# Patient Record
Sex: Female | Born: 1940 | Race: White | Hispanic: No | Smoking: Never smoker
Health system: Southern US, Community
[De-identification: ages and names within clinical notes are randomized; demographics above are authoritative.]

## PROBLEM LIST (undated history)

## (undated) DIAGNOSIS — J449 Chronic obstructive pulmonary disease, unspecified: Secondary | ICD-10-CM

## (undated) DIAGNOSIS — E119 Type 2 diabetes mellitus without complications: Secondary | ICD-10-CM

## (undated) DIAGNOSIS — K219 Gastro-esophageal reflux disease without esophagitis: Secondary | ICD-10-CM

## (undated) DIAGNOSIS — I10 Essential (primary) hypertension: Secondary | ICD-10-CM

## (undated) HISTORY — PX: BACK SURGERY: SHX140

## (undated) HISTORY — PX: ROTATOR CUFF REPAIR: SHX139

## (undated) HISTORY — PX: SINUS EXPLORATION: SHX5214

## (undated) HISTORY — PX: JOINT REPLACEMENT: SHX530

## (undated) HISTORY — PX: ABDOMINAL HYSTERECTOMY: SHX81

---

## 2013-02-09 ENCOUNTER — Emergency Department (HOSPITAL_BASED_OUTPATIENT_CLINIC_OR_DEPARTMENT_OTHER)
Admission: EM | Admit: 2013-02-09 | Discharge: 2013-02-10 | Disposition: A | Discharge status: Home / Self Care | Payer: Medicare PPO | Attending: Emergency Medicine | Admitting: Emergency Medicine

## 2013-02-09 ENCOUNTER — Encounter (HOSPITAL_BASED_OUTPATIENT_CLINIC_OR_DEPARTMENT_OTHER): Payer: Self-pay | Admitting: *Deleted

## 2013-02-09 DIAGNOSIS — Z79899 Other long term (current) drug therapy: Secondary | ICD-10-CM | POA: Insufficient documentation

## 2013-02-09 DIAGNOSIS — Z7982 Long term (current) use of aspirin: Secondary | ICD-10-CM | POA: Insufficient documentation

## 2013-02-09 DIAGNOSIS — J441 Chronic obstructive pulmonary disease with (acute) exacerbation: Secondary | ICD-10-CM | POA: Insufficient documentation

## 2013-02-09 DIAGNOSIS — R209 Unspecified disturbances of skin sensation: Secondary | ICD-10-CM | POA: Insufficient documentation

## 2013-02-09 DIAGNOSIS — E119 Type 2 diabetes mellitus without complications: Secondary | ICD-10-CM | POA: Insufficient documentation

## 2013-02-09 DIAGNOSIS — K219 Gastro-esophageal reflux disease without esophagitis: Secondary | ICD-10-CM | POA: Insufficient documentation

## 2013-02-09 DIAGNOSIS — I10 Essential (primary) hypertension: Secondary | ICD-10-CM | POA: Insufficient documentation

## 2013-02-09 DIAGNOSIS — IMO0002 Reserved for concepts with insufficient information to code with codable children: Secondary | ICD-10-CM | POA: Insufficient documentation

## 2013-02-09 HISTORY — DX: Type 2 diabetes mellitus without complications: E11.9

## 2013-02-09 HISTORY — DX: Gastro-esophageal reflux disease without esophagitis: K21.9

## 2013-02-09 HISTORY — DX: Chronic obstructive pulmonary disease, unspecified: J44.9

## 2013-02-09 HISTORY — DX: Essential (primary) hypertension: I10

## 2013-02-09 LAB — BASIC METABOLIC PANEL
BUN: 13 mg/dL (ref 6–23)
Calcium: 9.6 mg/dL (ref 8.4–10.5)
Creatinine, Ser: 1.2 mg/dL — ABNORMAL HIGH (ref 0.50–1.10)
GFR calc non Af Amer: 45 mL/min — ABNORMAL LOW (ref >90)
Glucose, Bld: 105 mg/dL — ABNORMAL HIGH (ref 70–99)
Potassium: 4.3 mEq/L (ref 3.5–5.1)

## 2013-02-09 NOTE — ED Notes (Signed)
MD at bedside. 

## 2013-02-09 NOTE — ED Notes (Signed)
Pt reports increased BP and left arm tingling x 6 hrs ago

## 2013-02-09 NOTE — ED Provider Notes (Signed)
CSN: [id number]     Arrival date & time 02/09/13  2028 History   First MD Initiated Contact with Patient 02/09/13 2117     Chief Complaint  Patient presents with  . Hypertension   (Consider location/radiation/quality/duration/timing/severity/associated sxs/prior Treatment) HPI Comments: Patient presents with elevated blood pressure.  She does have a history of hypertension. She usually takes her blood pressure in the morning but took it a little later today at 12:00. She denies any symptoms with it. She did have 2 episodes of tingling in her left forearm. Each lasted about 1 minute. She had no associated chest tightness or pain. She had no associated shortness of breath. No weakness in the arm. She had no leg symptoms. She denies any headache. She denies any speech or vision changes. She did have some shortness of breath and wheezing earlier consistent with her COPD and this improved after she took Advair. She's currently asymptomatic. She states her shortness of breath was similar to her past episodes and there is no unusual symptoms.  Patient is a 72 y.o. female presenting with hypertension.  Hypertension Associated symptoms include shortness of breath. Pertinent negatives include no chest pain, no abdominal pain and no headaches.    Past Medical History  Diagnosis Date  . Hypertension   . COPD (chronic obstructive pulmonary disease)   . GERD (gastroesophageal reflux disease)   . Diabetes mellitus without complication    Past Surgical History  Procedure Laterality Date  . Back surgery    . Joint replacement    . Sinus exploration    . Rotator cuff repair    . Abdominal hysterectomy     History reviewed. No pertinent family history. History  Substance Use Topics  . Smoking status: Never Smoker   . Smokeless tobacco: Not on file  . Alcohol Use: No   OB History   Grav Para Term Preterm Abortions TAB SAB Ect Mult Living                 Review of Systems  Constitutional:  Negative for fever, chills, diaphoresis and fatigue.  HENT: Negative for congestion, rhinorrhea and sneezing.   Eyes: Negative.   Respiratory: Positive for shortness of breath. Negative for cough and chest tightness.   Cardiovascular: Negative for chest pain and leg swelling.  Gastrointestinal: Negative for nausea, vomiting, abdominal pain, diarrhea and blood in stool.  Genitourinary: Negative for frequency, hematuria, flank pain and difficulty urinating.  Musculoskeletal: Negative for back pain and arthralgias.  Skin: Negative for rash.  Neurological: Positive for numbness. Negative for dizziness, speech difficulty, weakness and headaches.    Allergies  Compazine; Lisinopril; Oxycontin; and Zithromax  Home Medications   Current Outpatient Rx  Name  Route  Sig  Dispense  Refill  . alendronate (FOSAMAX) 70 MG tablet   Oral   Take 70 mg by mouth every 7 (seven) days. Take with a full glass of water on an empty stomach.         Marland Kitchen amitriptyline (ELAVIL) 50 MG tablet   Oral   Take 50 mg by mouth at bedtime.         Marland Kitchen aspirin 81 MG tablet   Oral   Take 81 mg by mouth daily.         Marland Kitchen atorvastatin (LIPITOR) 40 MG tablet   Oral   Take 40 mg by mouth daily.         . cyclobenzaprine (FLEXERIL) 10 MG tablet   Oral   Take  10 mg by mouth 3 (three) times daily as needed for muscle spasms.         . Fluticasone-Salmeterol (ADVAIR) 250-50 MCG/DOSE AEPB   Inhalation   Inhale 1 puff into the lungs every 12 (twelve) hours.         Marland Kitchen glipiZIDE (GLUCOTROL) 5 MG tablet   Oral   Take 5 mg by mouth 2 (two) times daily before a meal.         . HYDROcodone-acetaminophen (NORCO/VICODIN) 5-325 MG per tablet   Oral   Take 1 tablet by mouth every 6 (six) hours as needed for pain.         Marland Kitchen losartan (COZAAR) 50 MG tablet   Oral   Take 50 mg by mouth daily.         . metoprolol (LOPRESSOR) 50 MG tablet   Oral   Take 50 mg by mouth 2 (two) times daily.         Marland Kitchen  morphine (KADIAN) 30 MG 24 hr capsule   Oral   Take 30 mg by mouth daily.         Marland Kitchen omeprazole (PRILOSEC) 20 MG capsule   Oral   Take 20 mg by mouth daily.         . sitaGLIPtin (JANUVIA) 50 MG tablet   Oral   Take 50 mg by mouth daily.          BP 169/73  Pulse 78  Temp(Src) 98.7 F (37.1 C) (Oral)  Resp 18  Ht 5' (1.524 m)  Wt 185 lb (83.915 kg)  BMI 36.13 kg/m2  SpO2 99% Physical Exam  Constitutional: She is oriented to person, place, and time. She appears well-developed and well-nourished.  HENT:  Head: Normocephalic and atraumatic.  Eyes: Pupils are equal, round, and reactive to light.  Neck: Normal range of motion. Neck supple.  Cardiovascular: Normal rate, regular rhythm and normal heart sounds.   Pulmonary/Chest: Effort normal and breath sounds normal. No respiratory distress. She has no wheezes. She has no rales. She exhibits no tenderness.  Abdominal: Soft. Bowel sounds are normal. There is no tenderness. There is no rebound and no guarding.  Musculoskeletal: Normal range of motion. She exhibits no edema.  Lymphadenopathy:    She has no cervical adenopathy.  Neurological: She is alert and oriented to person, place, and time. She has normal strength. No cranial nerve deficit or sensory deficit. GCS eye subscore is 4. GCS verbal subscore is 5. GCS motor subscore is 6.  Finger to nose intact. Her motor strength is symmetric in the arms but she cannot lift either of her arms above chest height given rotator cuff injuries  Skin: Skin is warm and dry. No rash noted.  Psychiatric: She has a normal mood and affect.    ED Course  Procedures (including critical care time) Labs Review Results for orders placed during the hospital encounter of 02/09/13  BASIC METABOLIC PANEL      Result Value Range   Sodium 140  135 - 145 mEq/L   Potassium 4.3  3.5 - 5.1 mEq/L   Chloride 105  96 - 112 mEq/L   CO2 25  19 - 32 mEq/L   Glucose, Bld 105 (*) 70 - 99 mg/dL   BUN 13  6  - 23 mg/dL   Creatinine, Ser 1.61 (*) 0.50 - 1.10 mg/dL   Calcium 9.6  8.4 - 09.6 mg/dL   GFR calc non Af Amer 45 (*) >90 mL/min  GFR calc Af Amer 52 (*) >90 mL/min  TROPONIN I      Result Value Range   Troponin I <0.30  <0.30 ng/mL   No results found.   Date: 02/09/2013  Rate: 66  Rhythm: normal sinus rhythm  QRS Axis: normal  Intervals: normal  ST/T Wave abnormalities: normal  Conduction Disutrbances:none  Narrative Interpretation:   Old EKG Reviewed: none available   Imaging Review No results found.  MDM   1. Hypertension    Patient presents with elevation in her blood pressure. She currently has no other associated symptoms. She had 2 episodes of tingling in her left forearm but each lasted a minute or less. She had no other neurologic symptoms suggestive of a stroke. She had no other symptoms suggestive of acute coronary syndrome. She has been asymptomatic to the ED course. Identifies her that her creatinine was mildly elevated. We have no old blood work your for comparison. I advised her to have this followed up by her primary care physician. I advised her to have her blood pressure rechecked within the next few days by her primary care physician or return here as needed if she has any associated symptoms.    Rolan Bucco, MD 02/09/13 [phone]

## 2013-10-14 ENCOUNTER — Emergency Department (HOSPITAL_BASED_OUTPATIENT_CLINIC_OR_DEPARTMENT_OTHER)
Admission: EM | Admit: 2013-10-14 | Discharge: 2013-10-14 | Disposition: A | Discharge status: Other Acute Inpatient Hospital | Payer: Medicare PPO | Attending: Emergency Medicine | Admitting: Emergency Medicine

## 2013-10-14 ENCOUNTER — Emergency Department (HOSPITAL_BASED_OUTPATIENT_CLINIC_OR_DEPARTMENT_OTHER): Payer: Medicare PPO

## 2013-10-14 ENCOUNTER — Encounter (HOSPITAL_BASED_OUTPATIENT_CLINIC_OR_DEPARTMENT_OTHER): Payer: Self-pay | Admitting: Emergency Medicine

## 2013-10-14 DIAGNOSIS — M7981 Nontraumatic hematoma of soft tissue: Secondary | ICD-10-CM | POA: Diagnosis not present

## 2013-10-14 DIAGNOSIS — K219 Gastro-esophageal reflux disease without esophagitis: Secondary | ICD-10-CM | POA: Diagnosis not present

## 2013-10-14 DIAGNOSIS — Z7982 Long term (current) use of aspirin: Secondary | ICD-10-CM | POA: Insufficient documentation

## 2013-10-14 DIAGNOSIS — B372 Candidiasis of skin and nail: Secondary | ICD-10-CM | POA: Diagnosis not present

## 2013-10-14 DIAGNOSIS — J449 Chronic obstructive pulmonary disease, unspecified: Secondary | ICD-10-CM | POA: Diagnosis not present

## 2013-10-14 DIAGNOSIS — Z7983 Long term (current) use of bisphosphonates: Secondary | ICD-10-CM | POA: Insufficient documentation

## 2013-10-14 DIAGNOSIS — R609 Edema, unspecified: Secondary | ICD-10-CM | POA: Diagnosis not present

## 2013-10-14 DIAGNOSIS — IMO0002 Reserved for concepts with insufficient information to code with codable children: Secondary | ICD-10-CM | POA: Insufficient documentation

## 2013-10-14 DIAGNOSIS — N390 Urinary tract infection, site not specified: Secondary | ICD-10-CM | POA: Diagnosis not present

## 2013-10-14 DIAGNOSIS — J4489 Other specified chronic obstructive pulmonary disease: Secondary | ICD-10-CM | POA: Insufficient documentation

## 2013-10-14 DIAGNOSIS — F29 Unspecified psychosis not due to a substance or known physiological condition: Secondary | ICD-10-CM | POA: Insufficient documentation

## 2013-10-14 DIAGNOSIS — E119 Type 2 diabetes mellitus without complications: Secondary | ICD-10-CM | POA: Insufficient documentation

## 2013-10-14 DIAGNOSIS — I10 Essential (primary) hypertension: Secondary | ICD-10-CM | POA: Diagnosis not present

## 2013-10-14 DIAGNOSIS — R5381 Other malaise: Secondary | ICD-10-CM | POA: Diagnosis present

## 2013-10-14 DIAGNOSIS — Z79899 Other long term (current) drug therapy: Secondary | ICD-10-CM | POA: Diagnosis not present

## 2013-10-14 DIAGNOSIS — R0602 Shortness of breath: Secondary | ICD-10-CM | POA: Diagnosis not present

## 2013-10-14 DIAGNOSIS — N289 Disorder of kidney and ureter, unspecified: Secondary | ICD-10-CM

## 2013-10-14 DIAGNOSIS — R410 Disorientation, unspecified: Secondary | ICD-10-CM

## 2013-10-14 DIAGNOSIS — R5383 Other fatigue: Secondary | ICD-10-CM | POA: Diagnosis present

## 2013-10-14 LAB — COMPREHENSIVE METABOLIC PANEL
ALBUMIN: 4 g/dL (ref 3.5–5.2)
ALK PHOS: 107 U/L (ref 39–117)
ALT: 20 U/L (ref 0–35)
AST: 36 U/L (ref 0–37)
BILIRUBIN TOTAL: 0.9 mg/dL (ref 0.3–1.2)
BUN: 55 mg/dL — ABNORMAL HIGH (ref 6–23)
CHLORIDE: 94 meq/L — AB (ref 96–112)
CO2: 26 meq/L (ref 19–32)
CREATININE: 2.2 mg/dL — AB (ref 0.50–1.10)
Calcium: 13.8 mg/dL (ref 8.4–10.5)
GFR calc Af Amer: 25 mL/min — ABNORMAL LOW (ref >90)
GFR, EST NON AFRICAN AMERICAN: 21 mL/min — AB (ref >90)
Glucose, Bld: 119 mg/dL — ABNORMAL HIGH (ref 70–99)
POTASSIUM: 4.9 meq/L (ref 3.7–5.3)
Sodium: 136 mEq/L — ABNORMAL LOW (ref 137–147)
Total Protein: 8.3 g/dL (ref 6.0–8.3)

## 2013-10-14 LAB — CBC WITH DIFFERENTIAL/PLATELET
BASOS ABS: 0 10*3/uL (ref 0.0–0.1)
BASOS PCT: 0 % (ref 0–1)
Eosinophils Absolute: 0.1 10*3/uL (ref 0.0–0.7)
Eosinophils Relative: 1 % (ref 0–5)
HEMATOCRIT: 42.9 % (ref 36.0–46.0)
HEMOGLOBIN: 14.5 g/dL (ref 12.0–15.0)
LYMPHS PCT: 13 % (ref 12–46)
Lymphs Abs: 1.6 10*3/uL (ref 0.7–4.0)
MCH: 34.5 pg — ABNORMAL HIGH (ref 26.0–34.0)
MCHC: 33.8 g/dL (ref 30.0–36.0)
MCV: 102.1 fL — ABNORMAL HIGH (ref 78.0–100.0)
MONO ABS: 1 10*3/uL (ref 0.1–1.0)
Monocytes Relative: 9 % (ref 3–12)
NEUTROS ABS: 9 10*3/uL — AB (ref 1.7–7.7)
NEUTROS PCT: 77 % (ref 43–77)
Platelets: 232 10*3/uL (ref 150–400)
RBC: 4.2 MIL/uL (ref 3.87–5.11)
RDW: 13.2 % (ref 11.5–15.5)
WBC: 11.6 10*3/uL — AB (ref 4.0–10.5)

## 2013-10-14 LAB — PRO B NATRIURETIC PEPTIDE: PRO B NATRI PEPTIDE: 341.7 pg/mL — AB (ref 0–125)

## 2013-10-14 LAB — URINALYSIS, ROUTINE W REFLEX MICROSCOPIC
BILIRUBIN URINE: NEGATIVE
GLUCOSE, UA: NEGATIVE mg/dL
KETONES UR: NEGATIVE mg/dL
Nitrite: POSITIVE — AB
PROTEIN: NEGATIVE mg/dL
Specific Gravity, Urine: 1.019 (ref 1.005–1.030)
UROBILINOGEN UA: 1 mg/dL (ref 0.0–1.0)
pH: 6.5 (ref 5.0–8.0)

## 2013-10-14 LAB — URINE MICROSCOPIC-ADD ON

## 2013-10-14 LAB — TROPONIN I

## 2013-10-14 MED ORDER — SODIUM CHLORIDE 0.9 % IV BOLUS (SEPSIS)
500.0000 mL | Freq: Once | INTRAVENOUS | Status: AC
  Administered 2013-10-14: 500 mL via INTRAVENOUS

## 2013-10-14 MED ORDER — DEXTROSE 5 % IV SOLN
1.0000 g | Freq: Once | INTRAVENOUS | Status: AC
  Administered 2013-10-14: 1 g via INTRAVENOUS

## 2013-10-14 MED ORDER — FLUCONAZOLE 100 MG PO TABS
200.0000 mg | ORAL_TABLET | Freq: Once | ORAL | Status: AC
  Administered 2013-10-14: 200 mg via ORAL
  Filled 2013-10-14: qty 2

## 2013-10-14 MED ORDER — CEFTRIAXONE SODIUM 1 G IJ SOLR
INTRAMUSCULAR | Status: AC
  Filled 2013-10-14: qty 10

## 2013-10-14 NOTE — ED Notes (Signed)
Chest x-ray images and Cat [facility] at 1543 pm by me Sherrine Maples(Glenn).

## 2013-10-14 NOTE — ED Notes (Addendum)
Per daughter and patient she has been feeling weak and slipped out of her wheelchair last night and again this morning. Was [facility]B and released on Wed.  Med just added after discharge gabapentin, hydralazine, furosemide imdur er

## 2013-10-14 NOTE — ED Provider Notes (Addendum)
CSN: [id number]     Arrival date & time 10/14/13  1210 History   First MD Initiated Contact with Patient 10/14/13 1223     Chief Complaint  Patient presents with  . Fatigue     (Consider location/radiation/quality/duration/timing/severity/associated sxs/prior Treatment) HPI Comments: Patient presents with increased weakness and confusion. She was recently admitted to [facility]. She was admitted on Monday and discharged Friday which was yesterday. Per her daughter, it sounds that she was admitted for shortness of breath and CHF. Her daughter noted on Thursday that she was becoming more confused than normal. Yesterday on discharge, she seems fairly confused to the daughter and today her confusion was worse. She's been having some generalized weakness and difficulty walking related to that but it does not sound like she's had any ataxia. She denies any focal deficits or numbness in her extremities. She did have a Foley catheter in placed during the hospitalization but she currently denies any urinary symptoms. She denies any chest pain or shortness of breath although she said she's had some intermittent episodes over the last few days. She denies any fevers or chills. Her primary care physician is with [facility]. History is a bit limited due to her confusion.   Past Medical History  Diagnosis Date  . Hypertension   . COPD (chronic obstructive pulmonary disease)   . GERD (gastroesophageal reflux disease)   . Diabetes mellitus without complication    Past Surgical History  Procedure Laterality Date  . Back surgery    . Sinus exploration    . Rotator cuff repair      bilateral  . Abdominal hysterectomy    . Joint replacement      L knee   No family history on file. History  Substance Use Topics  . Smoking status: Never Smoker   . Smokeless tobacco: Not on file  . Alcohol Use: No   OB History   Grav Para Term Preterm Abortions TAB SAB Ect  Mult Living                 Review of Systems  Constitutional: Positive for fatigue. Negative for fever, chills and diaphoresis.  HENT: Negative for congestion, rhinorrhea and sneezing.   Eyes: Negative.   Respiratory: Positive for chest tightness and shortness of breath. Negative for cough.   Cardiovascular: Negative for chest pain and leg swelling.  Gastrointestinal: Negative for nausea, vomiting, abdominal pain, diarrhea and blood in stool.  Genitourinary: Negative for frequency, hematuria, flank pain and difficulty urinating.  Musculoskeletal: Negative for arthralgias and back pain.  Skin: Negative for rash.  Neurological: Positive for weakness (generalized). Negative for dizziness, speech difficulty, numbness and headaches.  Psychiatric/Behavioral: Positive for confusion.      Allergies  Compazine; Lisinopril; Oxycontin; and Zithromax  Home Medications   Prior to Admission medications   Medication Sig Start Date End Date Taking? Authorizing Provider  amLODipine (NORVASC) 2.5 MG tablet Take 2.5 mg by mouth daily.   Yes Historical Provider, MD  Cholecalciferol (VITAMIN D3) 1000 UNITS CAPS Take by mouth.   Yes Historical Provider, MD  furosemide (LASIX) 20 MG tablet Take 20 mg by mouth every other day.   Yes Historical Provider, MD  gabapentin (NEURONTIN) 100 MG capsule Take 100 mg by mouth 3 (three) times daily.   Yes Historical Provider, MD  hydrALAZINE (APRESOLINE) 25 MG tablet Take 25 mg by mouth 3 (three) times daily.   Yes Historical Provider, MD  isosorbide mononitrate (  IMDUR) 60 MG 24 hr tablet Take 60 mg by mouth daily.   Yes Historical Provider, MD  montelukast (SINGULAIR) 10 MG tablet Take 10 mg by mouth at bedtime.   Yes Historical Provider, MD  NYSTATIN, TOPICAL, POWD Apply topically.   Yes Historical Provider, MD  risedronate (ACTONEL) 150 MG tablet Take 150 mg by mouth every 30 (thirty) days. with water on empty stomach, nothing by mouth or lie down for next 30  minutes.   Yes Historical Provider, MD  tizanidine (ZANAFLEX) 2 MG capsule Take 2 mg by mouth 3 (three) times daily.   Yes Historical Provider, MD  alendronate (FOSAMAX) 70 MG tablet Take 70 mg by mouth every 7 (seven) days. Take with a full glass of water on an empty stomach.    Historical Provider, MD  amitriptyline (ELAVIL) 50 MG tablet Take 50 mg by mouth at bedtime.    Historical Provider, MD  aspirin 81 MG tablet Take 81 mg by mouth daily.    Historical Provider, MD  atorvastatin (LIPITOR) 40 MG tablet Take 40 mg by mouth daily.    Historical Provider, MD  cyclobenzaprine (FLEXERIL) 10 MG tablet Take 5 mg by mouth 3 (three) times daily as needed for muscle spasms.     Historical Provider, MD  Fluticasone-Salmeterol (ADVAIR) 250-50 MCG/DOSE AEPB Inhale 1 puff into the lungs every 12 (twelve) hours.    Historical Provider, MD  glipiZIDE (GLUCOTROL XL) 5 MG 24 hr tablet Take 5 mg by mouth daily.    Historical Provider, MD  glipiZIDE (GLUCOTROL) 5 MG tablet Take 5 mg by mouth 2 (two) times daily before a meal.    Historical Provider, MD  HYDROcodone-acetaminophen (NORCO/VICODIN) 5-325 MG per tablet Take 1 tablet by mouth every 6 (six) hours as needed for pain.    Historical Provider, MD  losartan (COZAAR) 50 MG tablet Take 50 mg by mouth daily.    Historical Provider, MD  metoprolol (LOPRESSOR) 50 MG tablet Take 50 mg by mouth 2 (two) times daily.    Historical Provider, MD  metoprolol succinate (TOPROL-XL) 50 MG 24 hr tablet Take 50 mg by mouth daily. Take with or immediately following a meal.    Historical Provider, MD  morphine (KADIAN) 30 MG 24 hr capsule Take 30 mg by mouth daily.    Historical Provider, MD  omeprazole (PRILOSEC) 20 MG capsule Take 20 mg by mouth daily.    Historical Provider, MD  sitaGLIPtin (JANUVIA) 50 MG tablet Take 50 mg by mouth daily.    Historical Provider, MD   BP 143/66  Pulse 99  Temp(Src) 98.2 F (36.8 C) (Oral)  Resp 20  SpO2 94% Physical Exam   Constitutional: She appears well-developed and well-nourished.  HENT:  Head: Normocephalic and atraumatic.  Eyes: Pupils are equal, round, and reactive to light.  Neck: Normal range of motion. Neck supple.  Cardiovascular: Normal rate, regular rhythm and normal heart sounds.   Pulmonary/Chest: Effort normal and breath sounds normal. No respiratory distress. She has no wheezes. She has no rales. She exhibits no tenderness.  Abdominal: Soft. Bowel sounds are normal. There is no tenderness. There is no rebound and no guarding.  Some ecchymosis across lower, no abd tenderness   Musculoskeletal: Normal range of motion. She exhibits edema (bilateral mild pitting edema.  skin  changes to pretibial area consistent with chronic edema).  Lymphadenopathy:    She has no cervical adenopathy.  Neurological: She is alert. She has normal strength. No cranial nerve deficit or  sensory deficit. Coordination normal. GCS eye subscore is 4. GCS verbal subscore is 4. GCS motor subscore is 6.  Finger to nose intact, no pronator drift, answers questions, but confused at times  Skin: Skin is warm and dry. No rash noted.  Candidal infection under left abdominal panus  Psychiatric: She has a normal mood and affect.    ED Course  Procedures (including critical care time) Labs Review Results for orders placed during the hospital encounter of 10/14/13  CBC WITH DIFFERENTIAL      Result Value Ref Range   WBC 11.6 (*) 4.0 - 10.5 K/uL   RBC 4.20  3.87 - 5.11 MIL/uL   Hemoglobin 14.5  12.0 - 15.0 g/dL   HCT 78.2  95.6 - 21.3 %   MCV 102.1 (*) 78.0 - 100.0 fL   MCH 34.5 (*) 26.0 - 34.0 pg   MCHC 33.8  30.0 - 36.0 g/dL   RDW 08.6  57.8 - 46.9 %   Platelets 232  150 - 400 K/uL   Neutrophils Relative % 77  43 - 77 %   Neutro Abs 9.0 (*) 1.7 - 7.7 K/uL   Lymphocytes Relative 13  12 - 46 %   Lymphs Abs 1.6  0.7 - 4.0 K/uL   Monocytes Relative 9  3 - 12 %   Monocytes Absolute 1.0  0.1 - 1.0 K/uL   Eosinophils  Relative 1  0 - 5 %   Eosinophils Absolute 0.1  0.0 - 0.7 K/uL   Basophils Relative 0  0 - 1 %   Basophils Absolute 0.0  0.0 - 0.1 K/uL  COMPREHENSIVE METABOLIC PANEL      Result Value Ref Range   Sodium 136 (*) 137 - 147 mEq/L   Potassium 4.9  3.7 - 5.3 mEq/L   Chloride 94 (*) 96 - 112 mEq/L   CO2 26  19 - 32 mEq/L   Glucose, Bld 119 (*) 70 - 99 mg/dL   BUN 55 (*) 6 - 23 mg/dL   Creatinine, Ser 6.29 (*) 0.50 - 1.10 mg/dL   Calcium 52.8 (*) 8.4 - 10.5 mg/dL   Total Protein 8.3  6.0 - 8.3 g/dL   Albumin 4.0  3.5 - 5.2 g/dL   AST 36  0 - 37 U/L   ALT 20  0 - 35 U/L   Alkaline Phosphatase 107  39 - 117 U/L   Total Bilirubin 0.9  0.3 - 1.2 mg/dL   GFR calc non Af Amer 21 (*) >90 mL/min   GFR calc Af Amer 25 (*) >90 mL/min  URINALYSIS, ROUTINE W REFLEX MICROSCOPIC      Result Value Ref Range   Color, Urine YELLOW  YELLOW   APPearance CLOUDY (*) CLEAR   Specific Gravity, Urine 1.019  1.005 - 1.030   pH 6.5  5.0 - 8.0   Glucose, UA NEGATIVE  NEGATIVE mg/dL   Hgb urine dipstick TRACE (*) NEGATIVE   Bilirubin Urine NEGATIVE  NEGATIVE   Ketones, ur NEGATIVE  NEGATIVE mg/dL   Protein, ur NEGATIVE  NEGATIVE mg/dL   Urobilinogen, UA 1.0  0.0 - 1.0 mg/dL   Nitrite POSITIVE (*) NEGATIVE   Leukocytes, UA LARGE (*) NEGATIVE  PRO B NATRIURETIC PEPTIDE      Result Value Ref Range   Pro B Natriuretic peptide (BNP) 341.7 (*) 0 - 125 pg/mL  TROPONIN I      Result Value Ref Range   Troponin I <0.30  <0.30 ng/mL  URINE  MICROSCOPIC-ADD ON      Result Value Ref Range   Squamous Epithelial / LPF RARE  RARE   WBC, UA TOO NUMEROUS TO COUNT  <3 WBC/hpf   RBC / HPF 0-2  <3 RBC/hpf   Bacteria, UA MANY (*) RARE   Dg Chest 2 View  10/14/2013   CLINICAL DATA:  Weakness, shortness of Breath  EXAM: CHEST - 2 VIEW  COMPARISON:  10/12/2013  FINDINGS: Relatively low lung volumes.  Heart size upper limits normal.  . Left pleural effusion suspected previously is no longer conspicuous. Visualized skeletal  structures are unremarkable.  IMPRESSION: No acute cardiopulmonary disease.   Electronically Signed   By: Oley Balmaniel  Hassell M.D.   On: 10/14/2013 12:59   Ct Head Wo Contrast  10/14/2013   CLINICAL DATA:  Weakness, confusion  EXAM: CT HEAD WITHOUT CONTRAST  TECHNIQUE: Contiguous axial images were obtained from the base of the skull through the vertex without intravenous contrast.  COMPARISON:  12/15/2008 by report only  FINDINGS: Atherosclerotic and physiologic intracranial calcifications.  Mild diffuse parenchymal atrophy.  There is no evidence of acute intracranial hemorrhage, brain edema, mass lesion, acute infarction, mass effect, or midline shift. Acute infarct may be inapparent on noncontrast CT. No other intra-axial abnormalities are seen, and the ventricles and sulci are within normal limits in size and symmetry. No abnormal extra-axial fluid collections or masses are identified. No significant calvarial abnormality.  IMPRESSION: Negative for bleed or other acute intracranial process.   Electronically Signed   By: Oley Balmaniel  Hassell M.D.   On: 10/14/2013 13:10      EKG Interpretation   Date/Time:  Saturday Oct 14 2013 12:41:01 EDT Ventricular Rate:  87 PR Interval:  160 QRS Duration: 80 QT Interval:  334 QTC Calculation: 401 R Axis:   21 Text Interpretation:  Normal sinus rhythm Nonspecific T wave abnormality  Abnormal ECG t wave flattening as compared to old eKg Confirmed by Yadira Hada   MD, Verdine Grenfell ([id number]) on 10/14/2013 12:52:53 PM       Imaging Review Dg Chest 2 View  10/14/2013   CLINICAL DATA:  Weakness, shortness of Breath  EXAM: CHEST - 2 VIEW  COMPARISON:  10/12/2013  FINDINGS: Relatively low lung volumes.  Heart size upper limits normal.  . Left pleural effusion suspected previously is no longer conspicuous. Visualized skeletal structures are unremarkable.  IMPRESSION: No acute cardiopulmonary disease.   Electronically Signed   By: Oley Balmaniel  Hassell M.D.   On: 10/14/2013 12:59   Ct Head Wo  Contrast  10/14/2013   CLINICAL DATA:  Weakness, confusion  EXAM: CT HEAD WITHOUT CONTRAST  TECHNIQUE: Contiguous axial images were obtained from the base of the skull through the vertex without intravenous contrast.  COMPARISON:  12/15/2008 by report only  FINDINGS: Atherosclerotic and physiologic intracranial calcifications.  Mild diffuse parenchymal atrophy.  There is no evidence of acute intracranial hemorrhage, brain edema, mass lesion, acute infarction, mass effect, or midline shift. Acute infarct may be inapparent on noncontrast CT. No other intra-axial abnormalities are seen, and the ventricles and sulci are within normal limits in size and symmetry. No abnormal extra-axial fluid collections or masses are identified. No significant calvarial abnormality.  IMPRESSION: Negative for bleed or other acute intracranial process.   Electronically Signed   By: Oley Balmaniel  Hassell M.D.   On: 10/14/2013 13:10     EKG Interpretation   Date/Time:  Saturday Oct 14 2013 12:41:01 EDT Ventricular Rate:  87 PR Interval:  160 QRS Duration: 80 QT  Interval:  334 QTC Calculation: 401 R Axis:   21 Text Interpretation:  Normal sinus rhythm Nonspecific T wave abnormality  Abnormal ECG t wave flattening as compared to old eKg Confirmed by Ellorie Kindall   MD, Coline Calkin [phone]) on 10/14/2013 12:52:53 PM      MDM   Final diagnoses:  UTI (lower urinary tract infection)  Renal insufficiency  Confusion  Candidal dermatitis    Patient presents with generalized weakness and confusion. Her creatinine is elevated as compared to her last baseline value here. Her calcium is mildly elevated however patient says that she has a history of hypercalcemia. She has evidence of UTI. Her daughter states that she's on a couple times and she was discharged yesterday and didn't seem comfortable with her going home. She was given dose Rocephin in the ED. I talked with the hospitalist, Dr. Claybon Jabs, at [facility] who has accepted  the patient for transfer. She does have a pretty good yeast infection in her abdominal wall pannus and I gave her a dose of Diflucan here in the ED.    Rolan Bucco, MD 10/14/13 1511  Rolan Bucco, MD 10/14/13 [phone]

## 2013-10-17 LAB — URINE CULTURE: Special Requests: NORMAL

## 2014-06-13 ENCOUNTER — Emergency Department (HOSPITAL_BASED_OUTPATIENT_CLINIC_OR_DEPARTMENT_OTHER): Payer: Medicare PPO

## 2014-06-13 ENCOUNTER — Encounter (HOSPITAL_BASED_OUTPATIENT_CLINIC_OR_DEPARTMENT_OTHER): Payer: Self-pay

## 2014-06-13 ENCOUNTER — Emergency Department (HOSPITAL_BASED_OUTPATIENT_CLINIC_OR_DEPARTMENT_OTHER)
Admission: EM | Admit: 2014-06-13 | Discharge: 2014-06-13 | Disposition: A | Discharge status: Home / Self Care | Payer: Medicare PPO | Attending: Emergency Medicine | Admitting: Emergency Medicine

## 2014-06-13 DIAGNOSIS — J189 Pneumonia, unspecified organism: Secondary | ICD-10-CM

## 2014-06-13 DIAGNOSIS — I10 Essential (primary) hypertension: Secondary | ICD-10-CM | POA: Diagnosis not present

## 2014-06-13 DIAGNOSIS — K219 Gastro-esophageal reflux disease without esophagitis: Secondary | ICD-10-CM | POA: Insufficient documentation

## 2014-06-13 DIAGNOSIS — Z7982 Long term (current) use of aspirin: Secondary | ICD-10-CM | POA: Insufficient documentation

## 2014-06-13 DIAGNOSIS — J449 Chronic obstructive pulmonary disease, unspecified: Secondary | ICD-10-CM | POA: Insufficient documentation

## 2014-06-13 DIAGNOSIS — R059 Cough, unspecified: Secondary | ICD-10-CM

## 2014-06-13 DIAGNOSIS — R05 Cough: Secondary | ICD-10-CM

## 2014-06-13 DIAGNOSIS — Z79899 Other long term (current) drug therapy: Secondary | ICD-10-CM | POA: Insufficient documentation

## 2014-06-13 DIAGNOSIS — E119 Type 2 diabetes mellitus without complications: Secondary | ICD-10-CM | POA: Insufficient documentation

## 2014-06-13 DIAGNOSIS — J159 Unspecified bacterial pneumonia: Secondary | ICD-10-CM | POA: Insufficient documentation

## 2014-06-13 LAB — COMPREHENSIVE METABOLIC PANEL
ALT: 13 U/L (ref 0–35)
ANION GAP: 8 (ref 5–15)
AST: 18 U/L (ref 0–37)
Albumin: 3.8 g/dL (ref 3.5–5.2)
Alkaline Phosphatase: 82 U/L (ref 39–117)
BILIRUBIN TOTAL: 0.7 mg/dL (ref 0.3–1.2)
BUN: 24 mg/dL — ABNORMAL HIGH (ref 6–23)
CALCIUM: 8.4 mg/dL (ref 8.4–10.5)
CHLORIDE: 102 meq/L (ref 96–112)
CO2: 24 mmol/L (ref 19–32)
Creatinine, Ser: 1.3 mg/dL — ABNORMAL HIGH (ref 0.50–1.10)
GFR calc Af Amer: 46 mL/min — ABNORMAL LOW (ref >90)
GFR, EST NON AFRICAN AMERICAN: 40 mL/min — AB (ref >90)
Glucose, Bld: 113 mg/dL — ABNORMAL HIGH (ref 70–99)
POTASSIUM: 3.8 mmol/L (ref 3.5–5.1)
Sodium: 134 mmol/L — ABNORMAL LOW (ref 135–145)
TOTAL PROTEIN: 7.4 g/dL (ref 6.0–8.3)

## 2014-06-13 LAB — CBC WITH DIFFERENTIAL/PLATELET
Basophils Absolute: 0 10*3/uL (ref 0.0–0.1)
Basophils Relative: 0 % (ref 0–1)
Eosinophils Absolute: 0.1 10*3/uL (ref 0.0–0.7)
Eosinophils Relative: 1 % (ref 0–5)
HCT: 36.6 % (ref 36.0–46.0)
HEMOGLOBIN: 12.1 g/dL (ref 12.0–15.0)
Lymphocytes Relative: 15 % (ref 12–46)
Lymphs Abs: 0.9 10*3/uL (ref 0.7–4.0)
MCH: 33.6 pg (ref 26.0–34.0)
MCHC: 33.1 g/dL (ref 30.0–36.0)
MCV: 101.7 fL — ABNORMAL HIGH (ref 78.0–100.0)
Monocytes Absolute: 0.5 10*3/uL (ref 0.1–1.0)
Monocytes Relative: 9 % (ref 3–12)
Neutro Abs: 4.5 10*3/uL (ref 1.7–7.7)
Neutrophils Relative %: 75 % (ref 43–77)
Platelets: 137 10*3/uL — ABNORMAL LOW (ref 150–400)
RBC: 3.6 MIL/uL — AB (ref 3.87–5.11)
RDW: 13.1 % (ref 11.5–15.5)
WBC: 6 10*3/uL (ref 4.0–10.5)

## 2014-06-13 LAB — I-STAT CG4 LACTIC ACID, ED: Lactic Acid, Venous: 1.73 mmol/L (ref 0.5–2.2)

## 2014-06-13 MED ORDER — DEXTROSE 5 % IV SOLN
1.0000 g | Freq: Once | INTRAVENOUS | Status: AC
  Administered 2014-06-13: 1 g via INTRAVENOUS

## 2014-06-13 MED ORDER — CEFTRIAXONE SODIUM 1 G IJ SOLR
INTRAMUSCULAR | Status: AC
  Filled 2014-06-13: qty 10

## 2014-06-13 MED ORDER — DOXYCYCLINE HYCLATE 100 MG PO CAPS: 100.0000 mg | ORAL_CAPSULE | Freq: Two times a day (BID) | ORAL | Status: AC

## 2014-06-13 MED ORDER — SODIUM CHLORIDE 0.9 % IV BOLUS (SEPSIS)
500.0000 mL | Freq: Once | INTRAVENOUS | Status: AC
  Administered 2014-06-13: 500 mL via INTRAVENOUS

## 2014-06-13 NOTE — Discharge Instructions (Signed)
Stop taking your norvasc (amlodipine) until rechecked by your family doctor.  Check your blood pressure one to two times daily.     Pneumonia Pneumonia is an infection of the lungs.  CAUSES Pneumonia may be caused by bacteria or a virus. Usually, these infections are caused by breathing infectious particles into the lungs (respiratory tract). SIGNS AND SYMPTOMS   Cough.  Fever.  Chest pain.  Increased rate of breathing.  Wheezing.  Mucus production. DIAGNOSIS  If you have the common symptoms of pneumonia, your health care provider will typically confirm the diagnosis with a chest X-ray. The X-ray will show an abnormality in the lung (pulmonary infiltrate) if you have pneumonia. Other tests of your blood, urine, or sputum may be done to find the specific cause of your pneumonia. Your health care provider may also do tests (blood gases or pulse oximetry) to see how well your lungs are working. TREATMENT  Some forms of pneumonia may be spread to other people when you cough or sneeze. You may be asked to wear a mask before and during your exam. Pneumonia that is caused by bacteria is treated with antibiotic medicine. Pneumonia that is caused by the influenza virus may be treated with an antiviral medicine. Most other viral infections must run their course. These infections will not respond to antibiotics.  HOME CARE INSTRUCTIONS   Cough suppressants may be used if you are losing too much rest. However, coughing protects you by clearing your lungs. You should avoid using cough suppressants if you can.  Your health care provider may have prescribed medicine if he or she thinks your pneumonia is caused by bacteria or influenza. Finish your medicine even if you start to feel better.  Your health care provider may also prescribe an expectorant. This loosens the mucus to be coughed up.  Take medicines only as directed by your health care provider.  Do not smoke. Smoking is a common cause of  bronchitis and can contribute to pneumonia. If you are a smoker and continue to smoke, your cough may last several weeks after your pneumonia has cleared.  A cold steam vaporizer or humidifier in your room or home may help loosen mucus.  Coughing is often worse at night. Sleeping in a semi-upright position in a recliner or using a couple pillows under your head will help with this.  Get rest as you feel it is needed. Your body will usually let you know when you need to rest. PREVENTION A pneumococcal shot (vaccine) is available to prevent a common bacterial cause of pneumonia. This is usually suggested for:  People over 73 years old.  Patients on chemotherapy.  People with chronic lung problems, such as bronchitis or emphysema.  People with immune system problems. If you are over 65 or have a high risk condition, you may receive the pneumococcal vaccine if you have not received it before. In some countries, a routine influenza vaccine is also recommended. This vaccine can help prevent some cases of pneumonia.You may be offered the influenza vaccine as part of your care. If you smoke, it is time to quit. You may receive instructions on how to stop smoking. Your health care provider can provide medicines and counseling to help you quit. SEEK MEDICAL CARE IF: You have a fever. SEEK IMMEDIATE MEDICAL CARE IF:   Your illness becomes worse. This is especially true if you are elderly or weakened from any other disease.  You cannot control your cough with suppressants and are losing  sleep.  You begin coughing up blood.  You develop pain which is getting worse or is uncontrolled with medicines.  Any of the symptoms which initially brought you in for treatment are getting worse rather than better.  You develop shortness of breath or chest pain. MAKE SURE YOU:   Understand these instructions.  Will watch your condition.  Will get help right away if you are not doing well or get  worse. Document Released: 05/25/2005 Document Revised: 10/09/2013 Document Reviewed: 08/14/2010 [facility] Patient Information 2015 [place], [place]. This information is not intended to replace advice given to you by your health care provider. Make sure you discuss any questions you have with your health care provider.

## 2014-06-13 NOTE — ED Notes (Signed)
Patient transported to X-ray 

## 2014-06-13 NOTE — ED Notes (Signed)
Pt was sent from PCP for hypotension "88/59" and elevated WBC-pt went to PCP c/o sore throat, wheezing-started Saturday

## 2014-06-13 NOTE — ED Provider Notes (Signed)
CSN: [id number]     Arrival date & time 06/13/14  1909 History  This chart was scribed for Yolanda FossaElizabeth Elver Stadler, MD by Evon Slackerrance Branch, ED Scribe. This patient was seen in room MH06/MH06 and the patient's care was started at 8:33 PM.      Chief Complaint  Patient presents with  . Hypotension     The history is provided by the patient. No language interpreter was used.   HPI Comments: Yolanda Patterson is a 74 y.o. female with PMHx of HTN, COPD, GERD, and diabetes who presents to the Emergency Department complaining of sore throat onset 4 days prior. Pt states she has associated productive cough and intermittent fevers max temp 101. Pt states she has also had gradual intermittent HA.  Pt states she is concerned that she had bronchitis. Pt states she has recently been around her neighbor who recently had bronchitis. Pt states she has taken ibuprofen which provided relief for her fever. Pt states that today when she went to urgent care today and her BP was 88/59. Pt states she takes medications for HTN and her normal systolic BP is around 120. Denies vomiting, abdominal pain, diarrhea, CP or leg swelling.   Past Medical History  Diagnosis Date  . Hypertension   . COPD (chronic obstructive pulmonary disease)   . GERD (gastroesophageal reflux disease)   . Diabetes mellitus without complication    Past Surgical History  Procedure Laterality Date  . Back surgery    . Sinus exploration    . Rotator cuff repair      bilateral  . Abdominal hysterectomy    . Joint replacement      L knee   No family history on file. History  Substance Use Topics  . Smoking status: Never Smoker   . Smokeless tobacco: Not on file  . Alcohol Use: No   OB History    No data available      Review of Systems  Constitutional: Positive for fatigue.  HENT: Positive for sore throat.   Respiratory: Positive for cough.   Cardiovascular: Negative for chest pain and leg swelling.  Gastrointestinal: Negative for vomiting,  abdominal pain and diarrhea.  Neurological: Positive for headaches.  All other systems reviewed and are negative.    Allergies  Compazine; Lisinopril; Oxycontin; and Zithromax  Home Medications   Prior to Admission medications   Medication Sig Start Date End Date Taking? Authorizing Provider  alendronate (FOSAMAX) 70 MG tablet Take 70 mg by mouth every 7 (seven) days. Take with a full glass of water on an empty stomach.    Historical Provider, MD  amitriptyline (ELAVIL) 50 MG tablet Take 50 mg by mouth at bedtime.    Historical Provider, MD  amLODipine (NORVASC) 2.5 MG tablet Take 2.5 mg by mouth daily.    Historical Provider, MD  aspirin 81 MG tablet Take 81 mg by mouth daily.    Historical Provider, MD  atorvastatin (LIPITOR) 40 MG tablet Take 40 mg by mouth daily.    Historical Provider, MD  Cholecalciferol (VITAMIN D3) 1000 UNITS CAPS Take by mouth.    Historical Provider, MD  cyclobenzaprine (FLEXERIL) 10 MG tablet Take 5 mg by mouth 3 (three) times daily as needed for muscle spasms.     Historical Provider, MD  Fluticasone-Salmeterol (ADVAIR) 250-50 MCG/DOSE AEPB Inhale 1 puff into the lungs every 12 (twelve) hours.    Historical Provider, MD  furosemide (LASIX) 20 MG tablet Take 20 mg by mouth every other day.  Historical Provider, MD  gabapentin (NEURONTIN) 100 MG capsule Take 100 mg by mouth 3 (three) times daily.    Historical Provider, MD  glipiZIDE (GLUCOTROL XL) 5 MG 24 hr tablet Take 5 mg by mouth daily.    Historical Provider, MD  glipiZIDE (GLUCOTROL) 5 MG tablet Take 5 mg by mouth 2 (two) times daily before a meal.    Historical Provider, MD  hydrALAZINE (APRESOLINE) 25 MG tablet Take 25 mg by mouth 3 (three) times daily.    Historical Provider, MD  HYDROcodone-acetaminophen (NORCO/VICODIN) 5-325 MG per tablet Take 1 tablet by mouth every 6 (six) hours as needed for pain.    Historical Provider, MD  isosorbide mononitrate (IMDUR) 60 MG 24 hr tablet Take 60 mg by mouth  daily.    Historical Provider, MD  losartan (COZAAR) 50 MG tablet Take 50 mg by mouth daily.    Historical Provider, MD  metoprolol (LOPRESSOR) 50 MG tablet Take 50 mg by mouth 2 (two) times daily.    Historical Provider, MD  metoprolol succinate (TOPROL-XL) 50 MG 24 hr tablet Take 50 mg by mouth daily. Take with or immediately following a meal.    Historical Provider, MD  montelukast (SINGULAIR) 10 MG tablet Take 10 mg by mouth at bedtime.    Historical Provider, MD  morphine (KADIAN) 30 MG 24 hr capsule Take 30 mg by mouth daily.    Historical Provider, MD  NYSTATIN, TOPICAL, POWD Apply topically.    Historical Provider, MD  omeprazole (PRILOSEC) 20 MG capsule Take 20 mg by mouth daily.    Historical Provider, MD  risedronate (ACTONEL) 150 MG tablet Take 150 mg by mouth every 30 (thirty) days. with water on empty stomach, nothing by mouth or lie down for next 30 minutes.    Historical Provider, MD  sitaGLIPtin (JANUVIA) 50 MG tablet Take 50 mg by mouth daily.    Historical Provider, MD  tizanidine (ZANAFLEX) 2 MG capsule Take 2 mg by mouth 3 (three) times daily.    Historical Provider, MD   Triage Vitals: BP 124/63 mmHg  Pulse 91  Temp(Src) 99.9 F (37.7 C) (Oral)  Ht 5' (1.524 m)  Wt 183 lb (83.008 kg)  BMI 35.74 kg/m2  SpO2 97%  Physical Exam  Constitutional: She is oriented to person, place, and time. She appears well-developed and well-nourished.  HENT:  Head: Normocephalic and atraumatic.  Cardiovascular: Normal rate and regular rhythm.   No murmur heard. Pulmonary/Chest: Effort normal. No respiratory distress. She has rhonchi.  Rhonchi in left upper lobe.   Abdominal: Soft. There is no tenderness. There is no rebound and no guarding.  Musculoskeletal: She exhibits edema. She exhibits no tenderness.  1+ pitting edema in bilateral lower extremities.    Neurological: She is alert and oriented to person, place, and time.  Skin: Skin is warm and dry.  Psychiatric: She has a  normal mood and affect. Her behavior is normal.  Nursing note and vitals reviewed.   ED Course  Procedures (including critical care time) DIAGNOSTIC STUDIES: Oxygen Saturation is 97% on RA, normal by my interpretation.    COORDINATION OF CARE: 9:10 PM-Discussed treatment plan with pt at bedside and pt agreed to plan.    Labs Review Labs Reviewed  COMPREHENSIVE METABOLIC PANEL - Abnormal; Notable for the following:    Sodium 134 (*)    Glucose, Bld 113 (*)    BUN 24 (*)    Creatinine, Ser 1.30 (*)    GFR calc non Af  Amer 40 (*)    GFR calc Af Amer 46 (*)    All other components within normal limits  CBC WITH DIFFERENTIAL - Abnormal; Notable for the following:    RBC 3.60 (*)    MCV 101.7 (*)    Platelets 137 (*)    All other components within normal limits  I-STAT CG4 LACTIC ACID, ED    Imaging Review Dg Chest 2 View  06/13/2014   CLINICAL DATA:  Sore throat, cough, wheezing starting Saturday, history of asthma  EXAM: CHEST  2 VIEW  COMPARISON:  10/14/2013  FINDINGS: Cardiomediastinal silhouette is stable. No acute infiltrate or pleural effusion. No pulmonary edema. Mild degenerative changes thoracic spine.  IMPRESSION: No active cardiopulmonary disease.   Electronically Signed   By: Natasha Mead M.D.   On: 06/13/2014 21:17     EKG Interpretation   Date/Time:  Wednesday June 13 2014 21:22:27 EST Ventricular Rate:  83 PR Interval:  156 QRS Duration: 62 QT Interval:  370 QTC Calculation: 434 R Axis:   54 Text Interpretation:  Normal sinus rhythm Normal ECG Confirmed by Lincoln Brigham [phone] on 06/13/2014 9:44:04 PM      MDM   Final diagnoses:  Cough  Community acquired pneumonia    Pt here for evaluation of fever, cough, had low blood pressures at urgent care.  Examination is consistent with pneumonia. Presentation and history is not consistent with PE or acute CHF exacerbation. Patient is not septic or toxic on exam and has no respiratory distress. BMP is with  stable renal insufficiency. Discussed with patient finding of pneumonia and holding Norvasc for now. Treated with one dose of Rocephin. Discussed checking daily blood pressures at home with close PCP follow-up. Prescribed him for doxycycline written as patient has allergy to azithromycin. Return precautions were discussed.  I personally performed the services described in this documentation, which was scribed in my presence. The recorded information has been reviewed and is accurate.      Yolanda Fossa, MD 06/13/14 [phone]

## 2015-06-18 IMAGING — CR DG CHEST 2V
1 series · 1 of 1 positions shown · non-contrast
Comparison: 10/12/2013

CLINICAL DATA: Weakness, shortness of Breath

EXAM:
CHEST - 2 VIEW

[view not recorded]
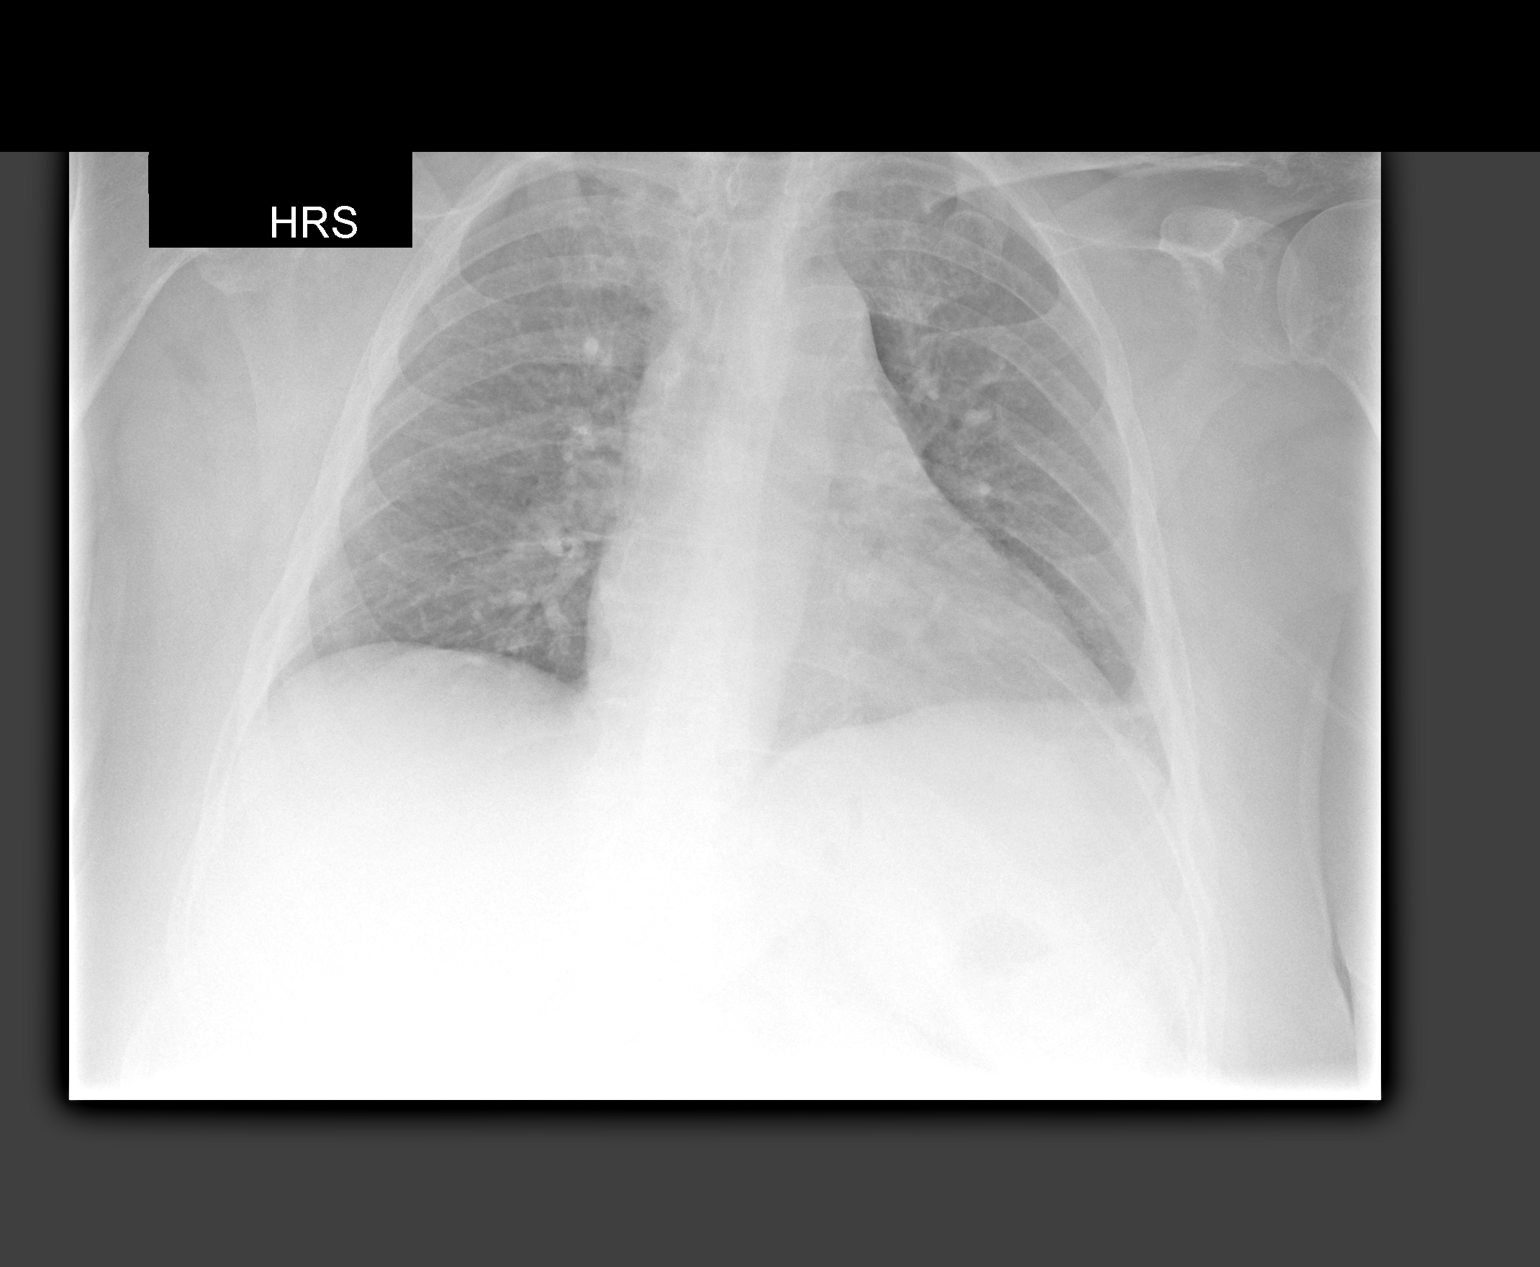

[1 of 1 positions shown; findings below may reference images not displayed]

FINDINGS: Relatively low lung volumes.  Heart size upper limits normal.  .
Left pleural effusion suspected previously is no longer conspicuous.
Visualized skeletal structures are unremarkable.
IMPRESSION: No acute cardiopulmonary disease.

## 2016-02-15 IMAGING — CR DG CHEST 2V
2 series · 2 of 2 positions shown · non-contrast
Comparison: 10/14/2013

CLINICAL DATA: Sore throat, cough, wheezing starting [REDACTED],
history of asthma

EXAM:
CHEST  2 VIEW

[w chest pa]
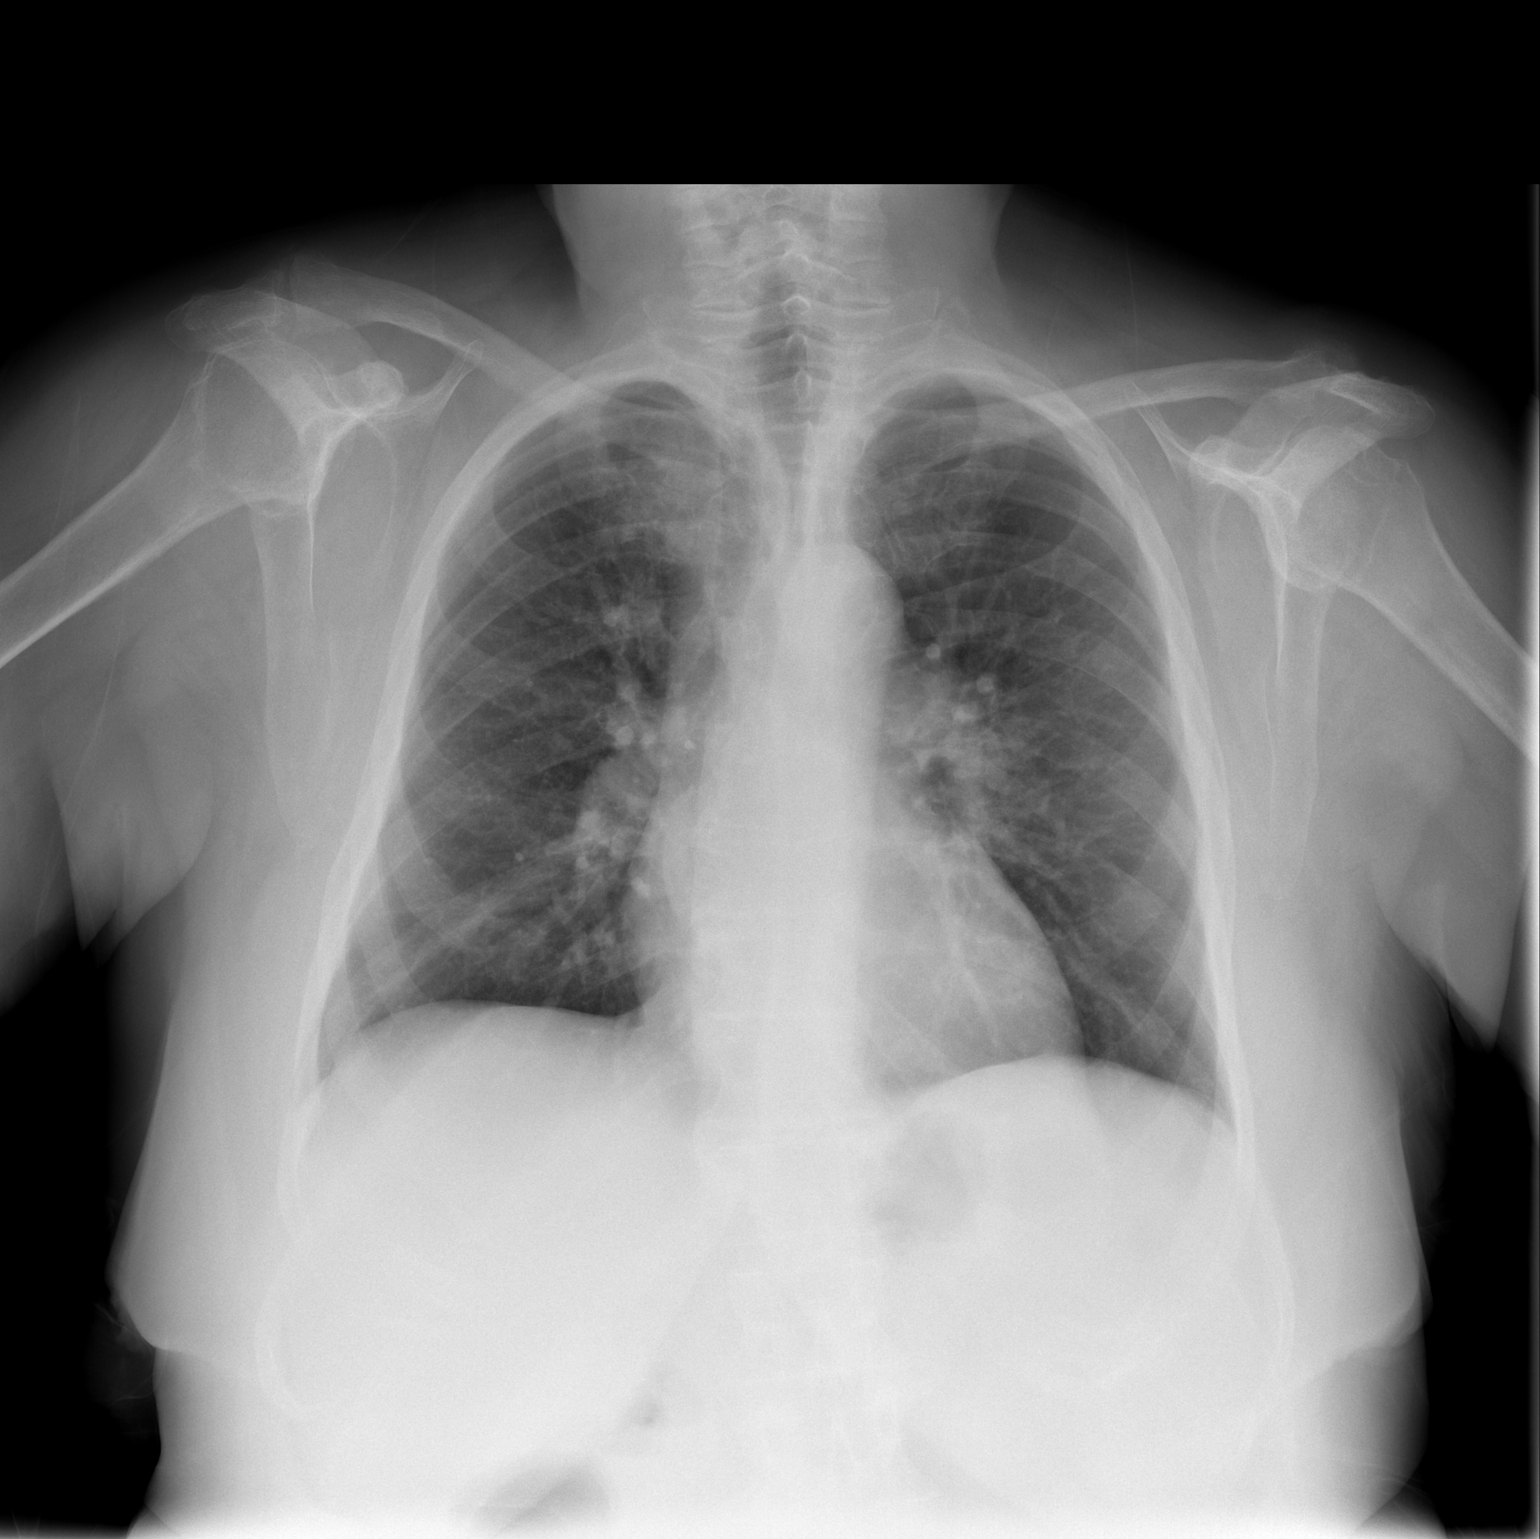

[w chest lat]
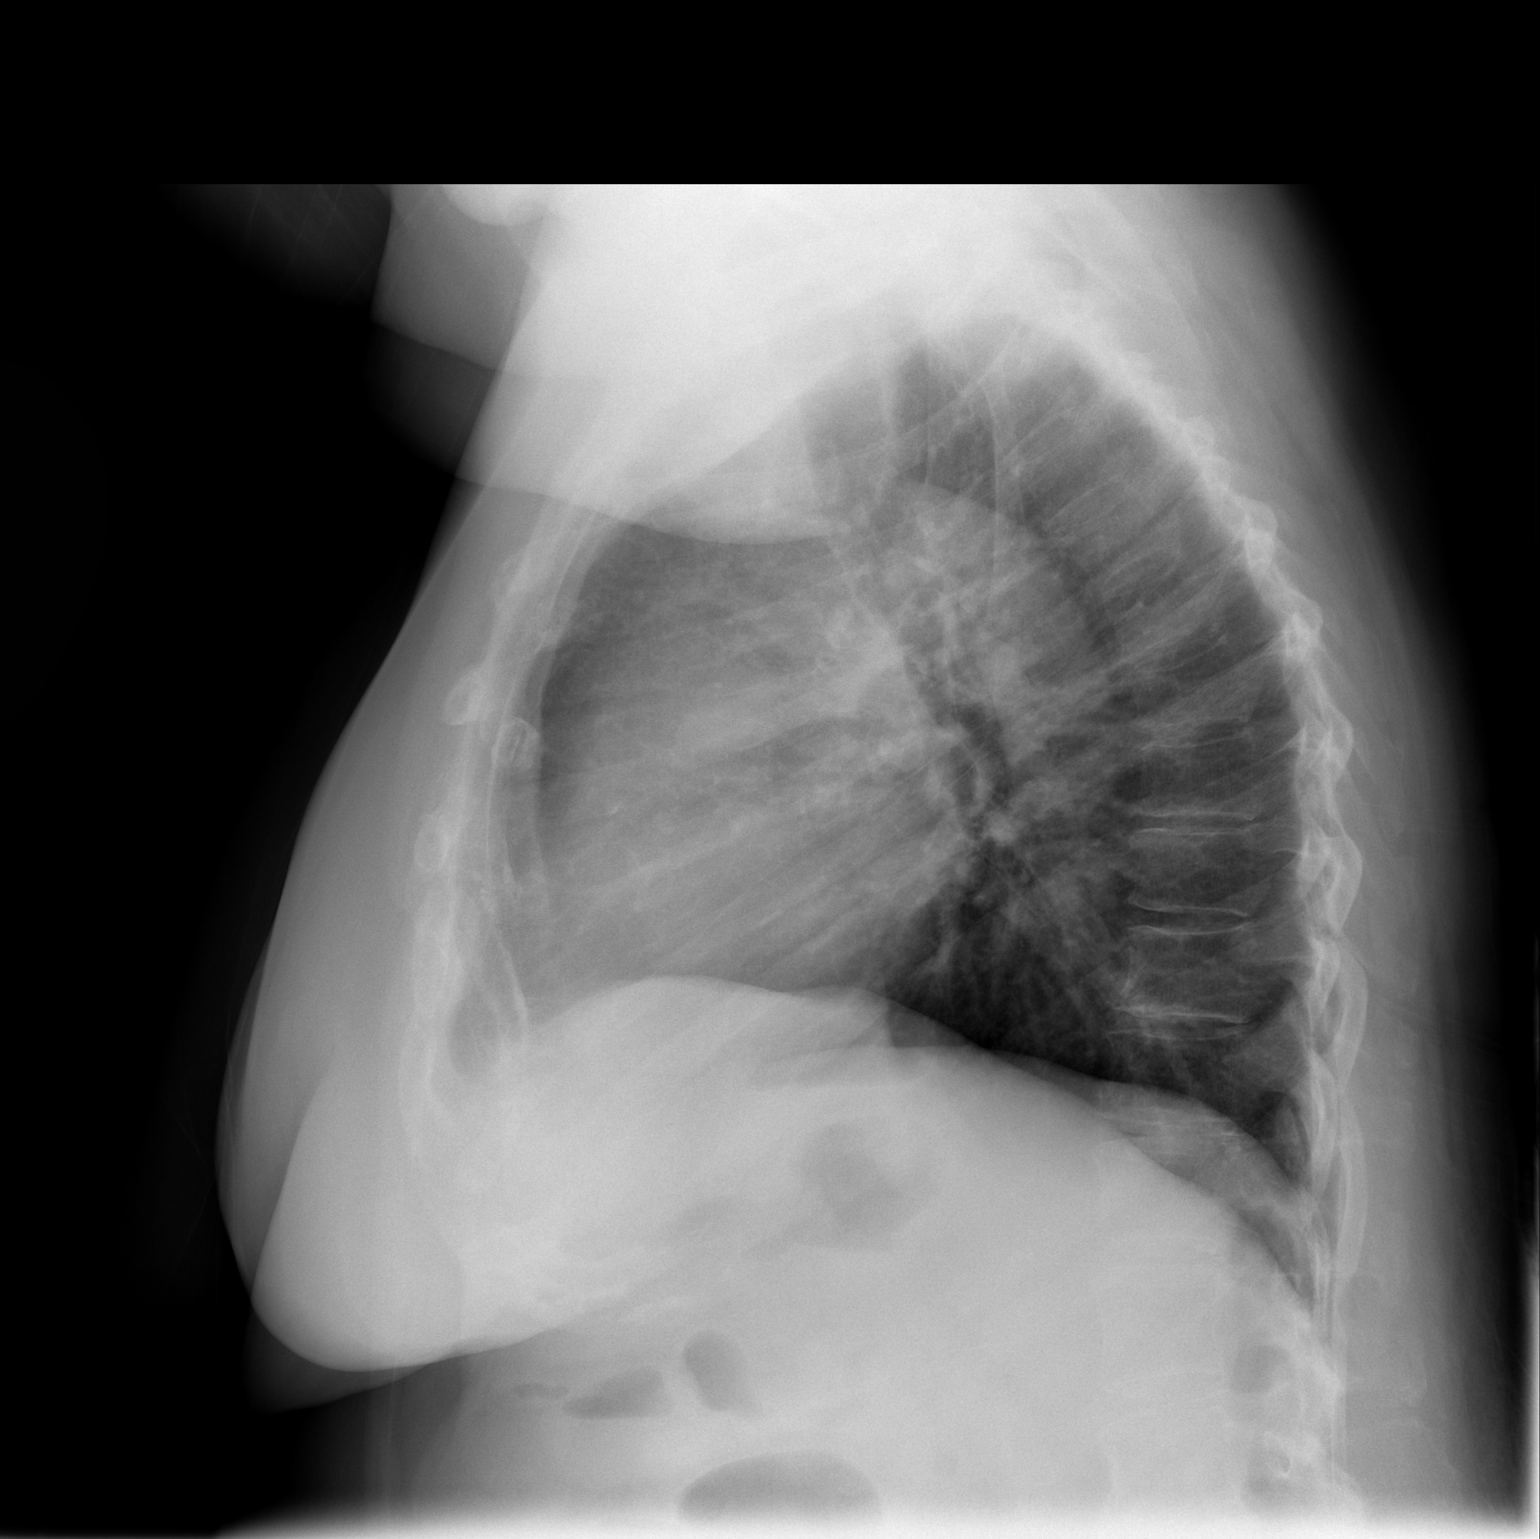

[2 of 2 positions shown; findings below may reference images not displayed]

FINDINGS: Cardiomediastinal silhouette is stable. No acute infiltrate or
pleural effusion. No pulmonary edema. Mild degenerative changes
thoracic spine.
IMPRESSION: No active cardiopulmonary disease.

## 2017-02-06 DEATH — deceased
